# Patient Record
Sex: Male | Born: 1975 | Race: Black or African American | Hispanic: No | Marital: Single | Smoking: Current every day smoker
Health system: Southern US, Community
[De-identification: ages and names within clinical notes are randomized; demographics above are authoritative.]

---

## 1998-09-21 ENCOUNTER — Emergency Department (HOSPITAL_COMMUNITY): Admission: EM | Admit: 1998-09-21 | Discharge: 1998-09-21 | Payer: Self-pay | Admitting: Emergency Medicine

## 2004-05-26 ENCOUNTER — Emergency Department (HOSPITAL_COMMUNITY): Admission: EM | Admit: 2004-05-26 | Discharge: 2004-05-26 | Payer: Self-pay | Admitting: Emergency Medicine

## 2004-05-27 ENCOUNTER — Emergency Department (HOSPITAL_COMMUNITY): Admission: EM | Admit: 2004-05-27 | Discharge: 2004-05-27 | Payer: Self-pay | Admitting: Emergency Medicine

## 2005-07-06 ENCOUNTER — Emergency Department (HOSPITAL_COMMUNITY): Admission: EM | Admit: 2005-07-06 | Discharge: 2005-07-06 | Payer: Self-pay | Admitting: Emergency Medicine

## 2005-07-31 ENCOUNTER — Emergency Department (HOSPITAL_COMMUNITY): Admission: EM | Admit: 2005-07-31 | Discharge: 2005-07-31 | Payer: Self-pay | Admitting: Emergency Medicine

## 2007-05-11 IMAGING — CR DG CHEST 2V
2 series · 2 of 2 positions shown · non-contrast
Comparison: none

CLINICAL DATA: Cough.  Vomiting blood.
 PA AND LATERAL:

[w chest pa]
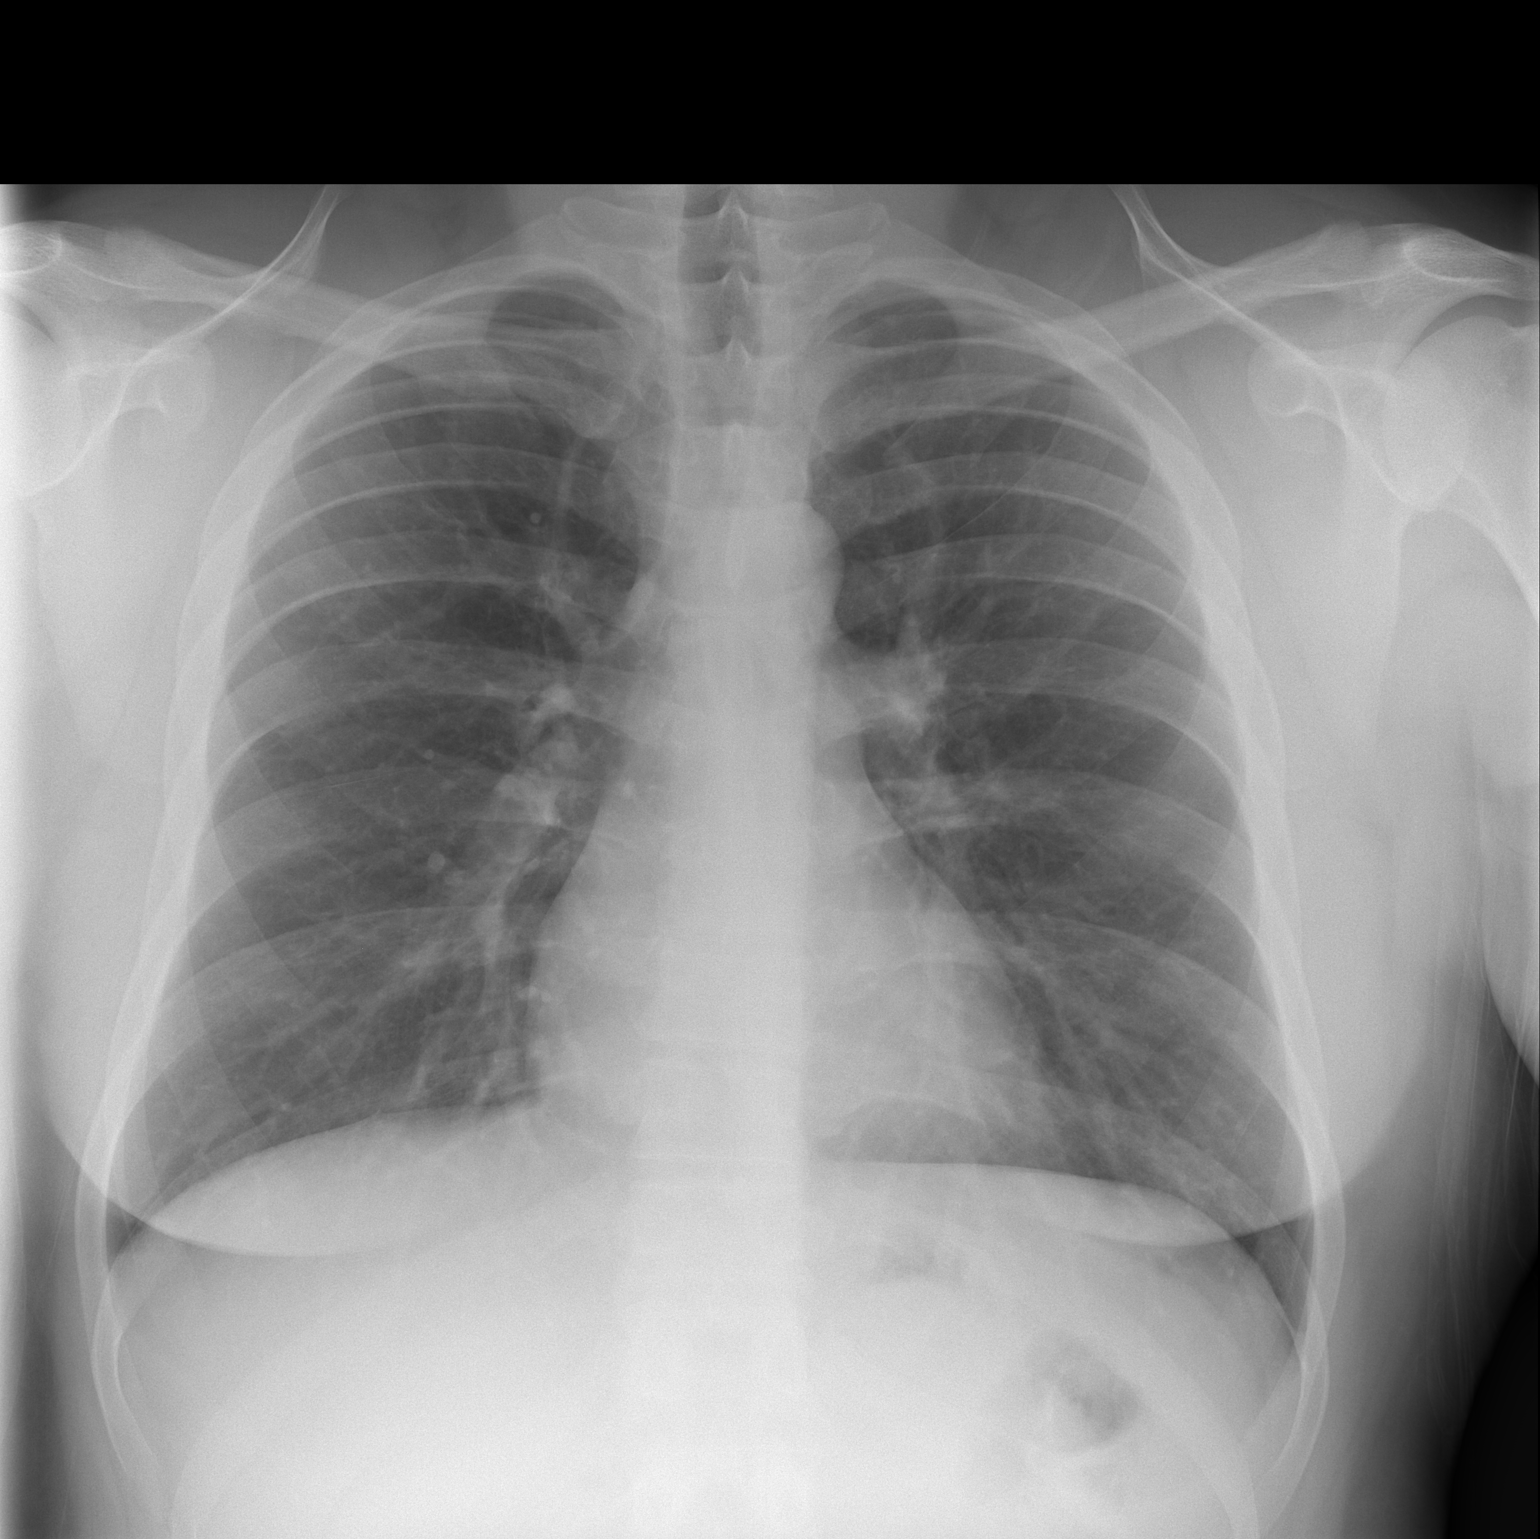

[w chest lat]
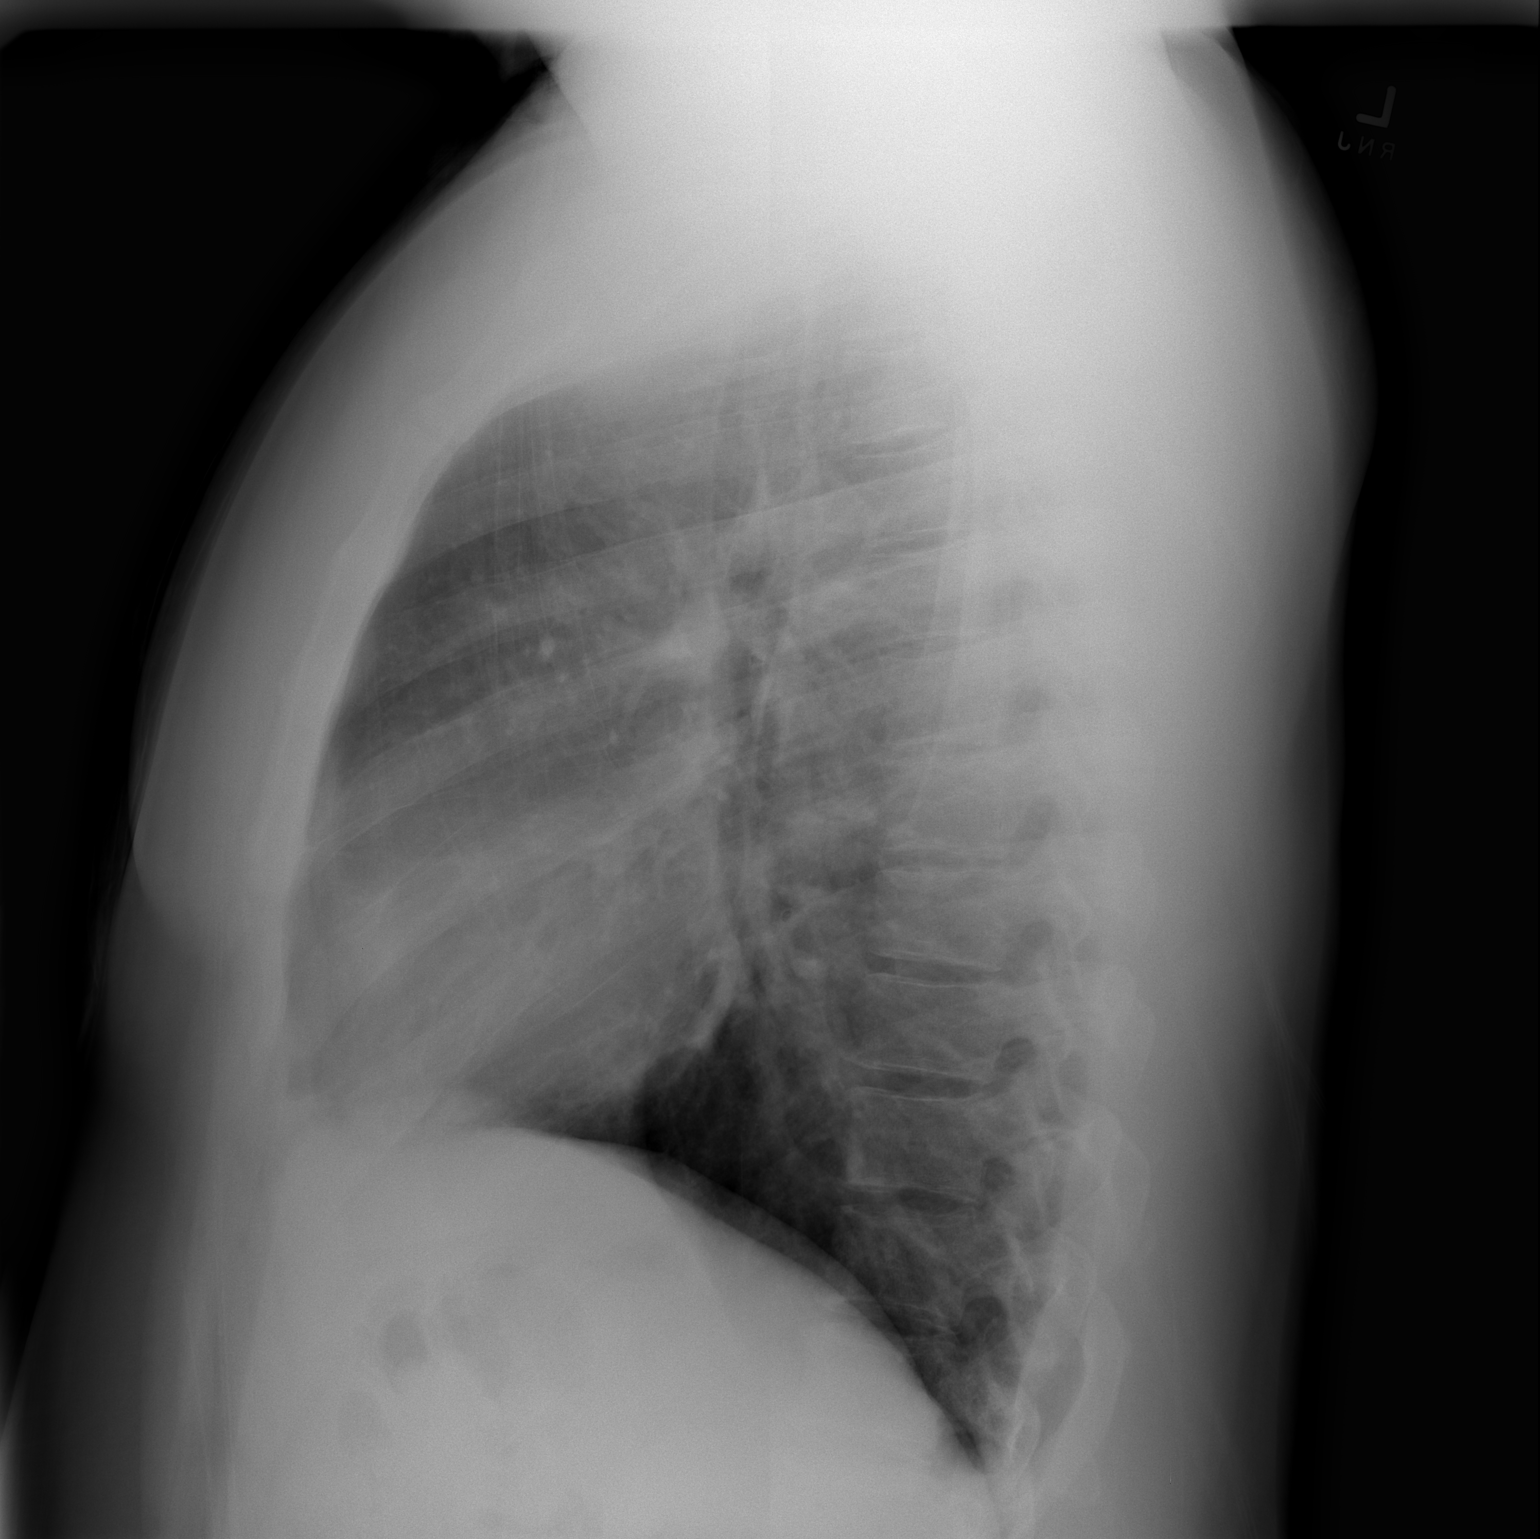

[2 of 2 positions shown; findings below may reference images not displayed]

FINDINGS: Lungs are clear.  Heart size is normal.  No effusion or focal bony abnormality.
IMPRESSION: No acute disease.

## 2007-06-05 IMAGING — CR DG CHEST 2V
2 series · 2 of 2 positions shown · non-contrast
Comparison: none

HISTORY: Chest pain, cough, dyspnea, smoker

CHEST 2 VIEWS:
Comparison 07/06/2005
Normal heart size, mediastinal contours, and vascularity.
Lungs clear.
No effusion or pneumothorax.
Bones unremarkable.

[w chest pa]
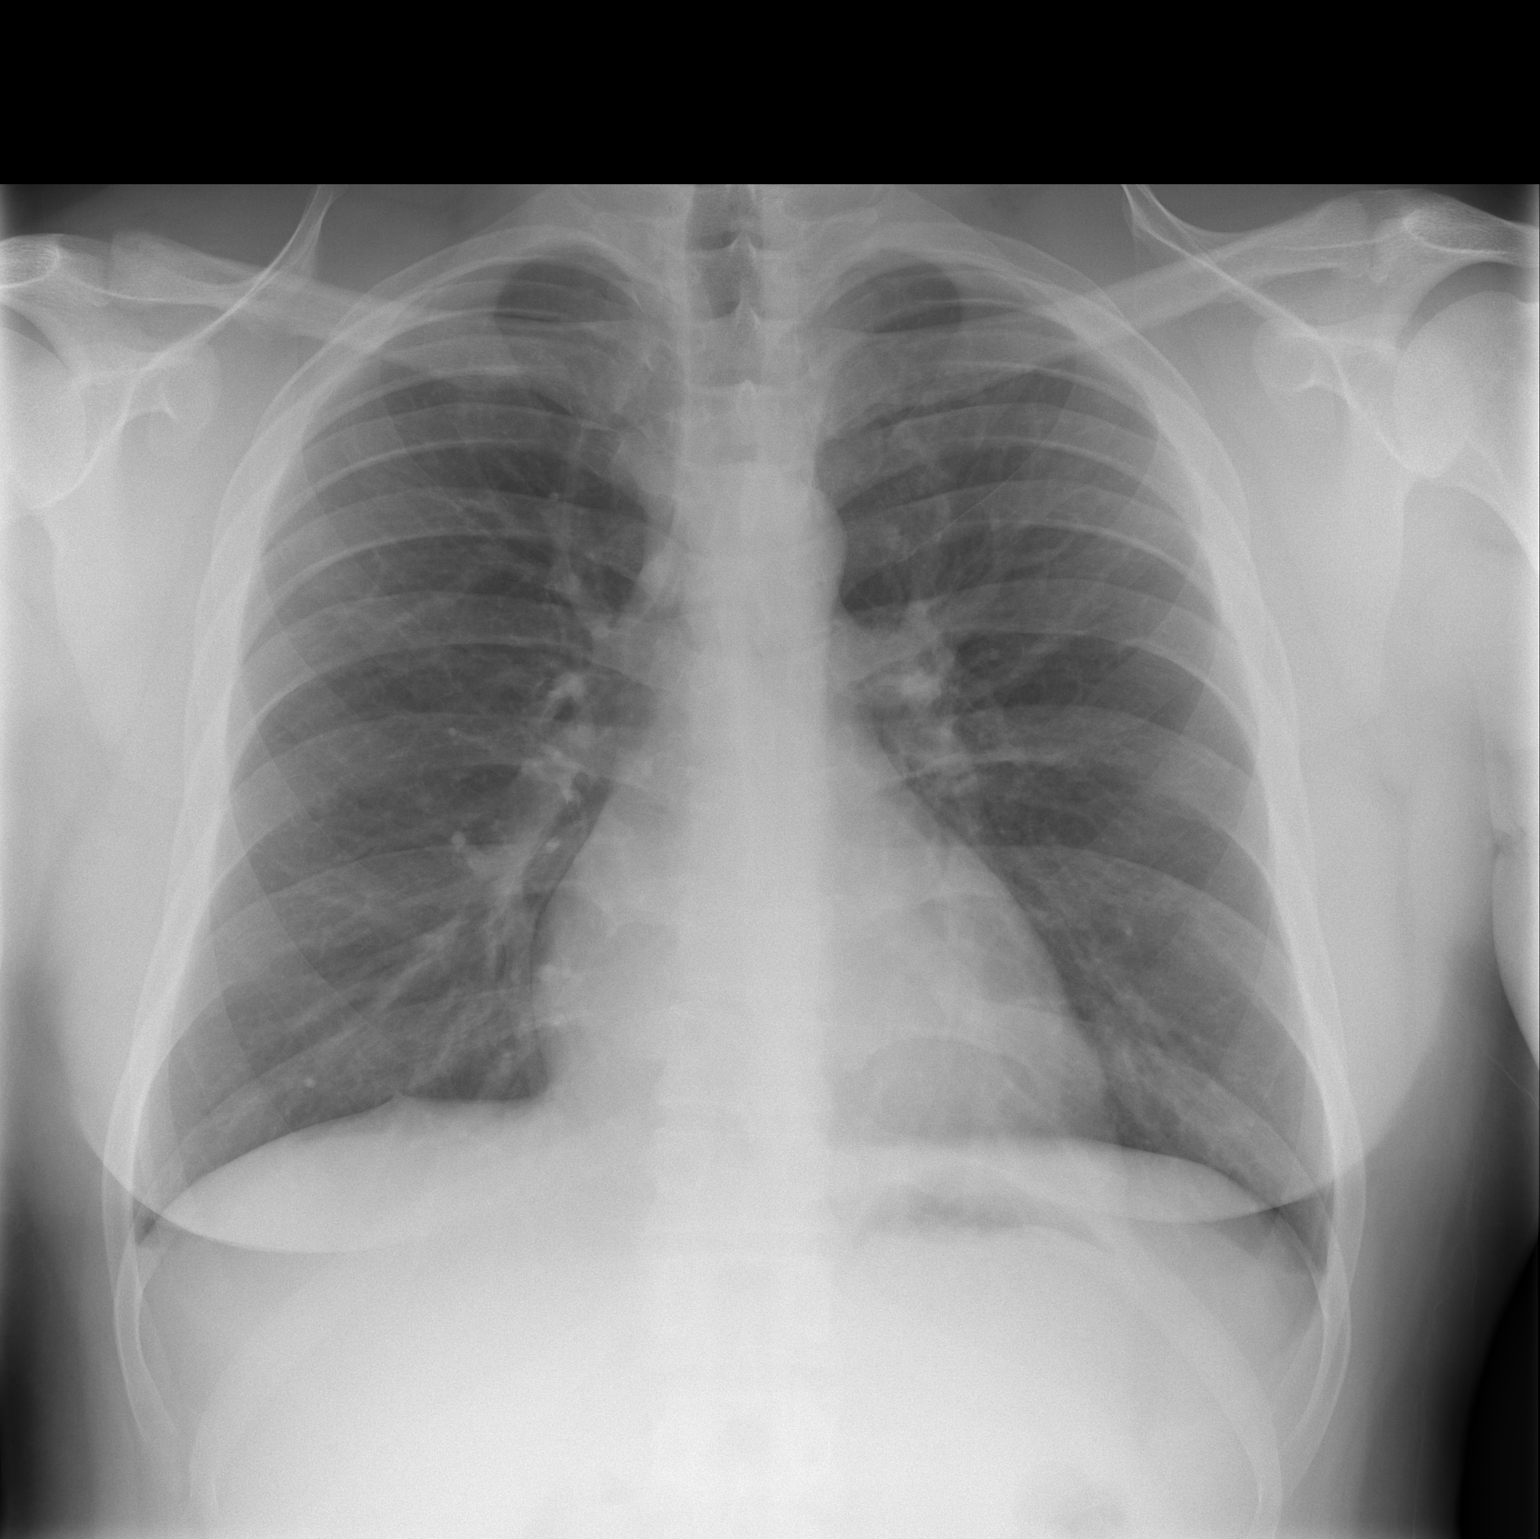

[w chest lat]
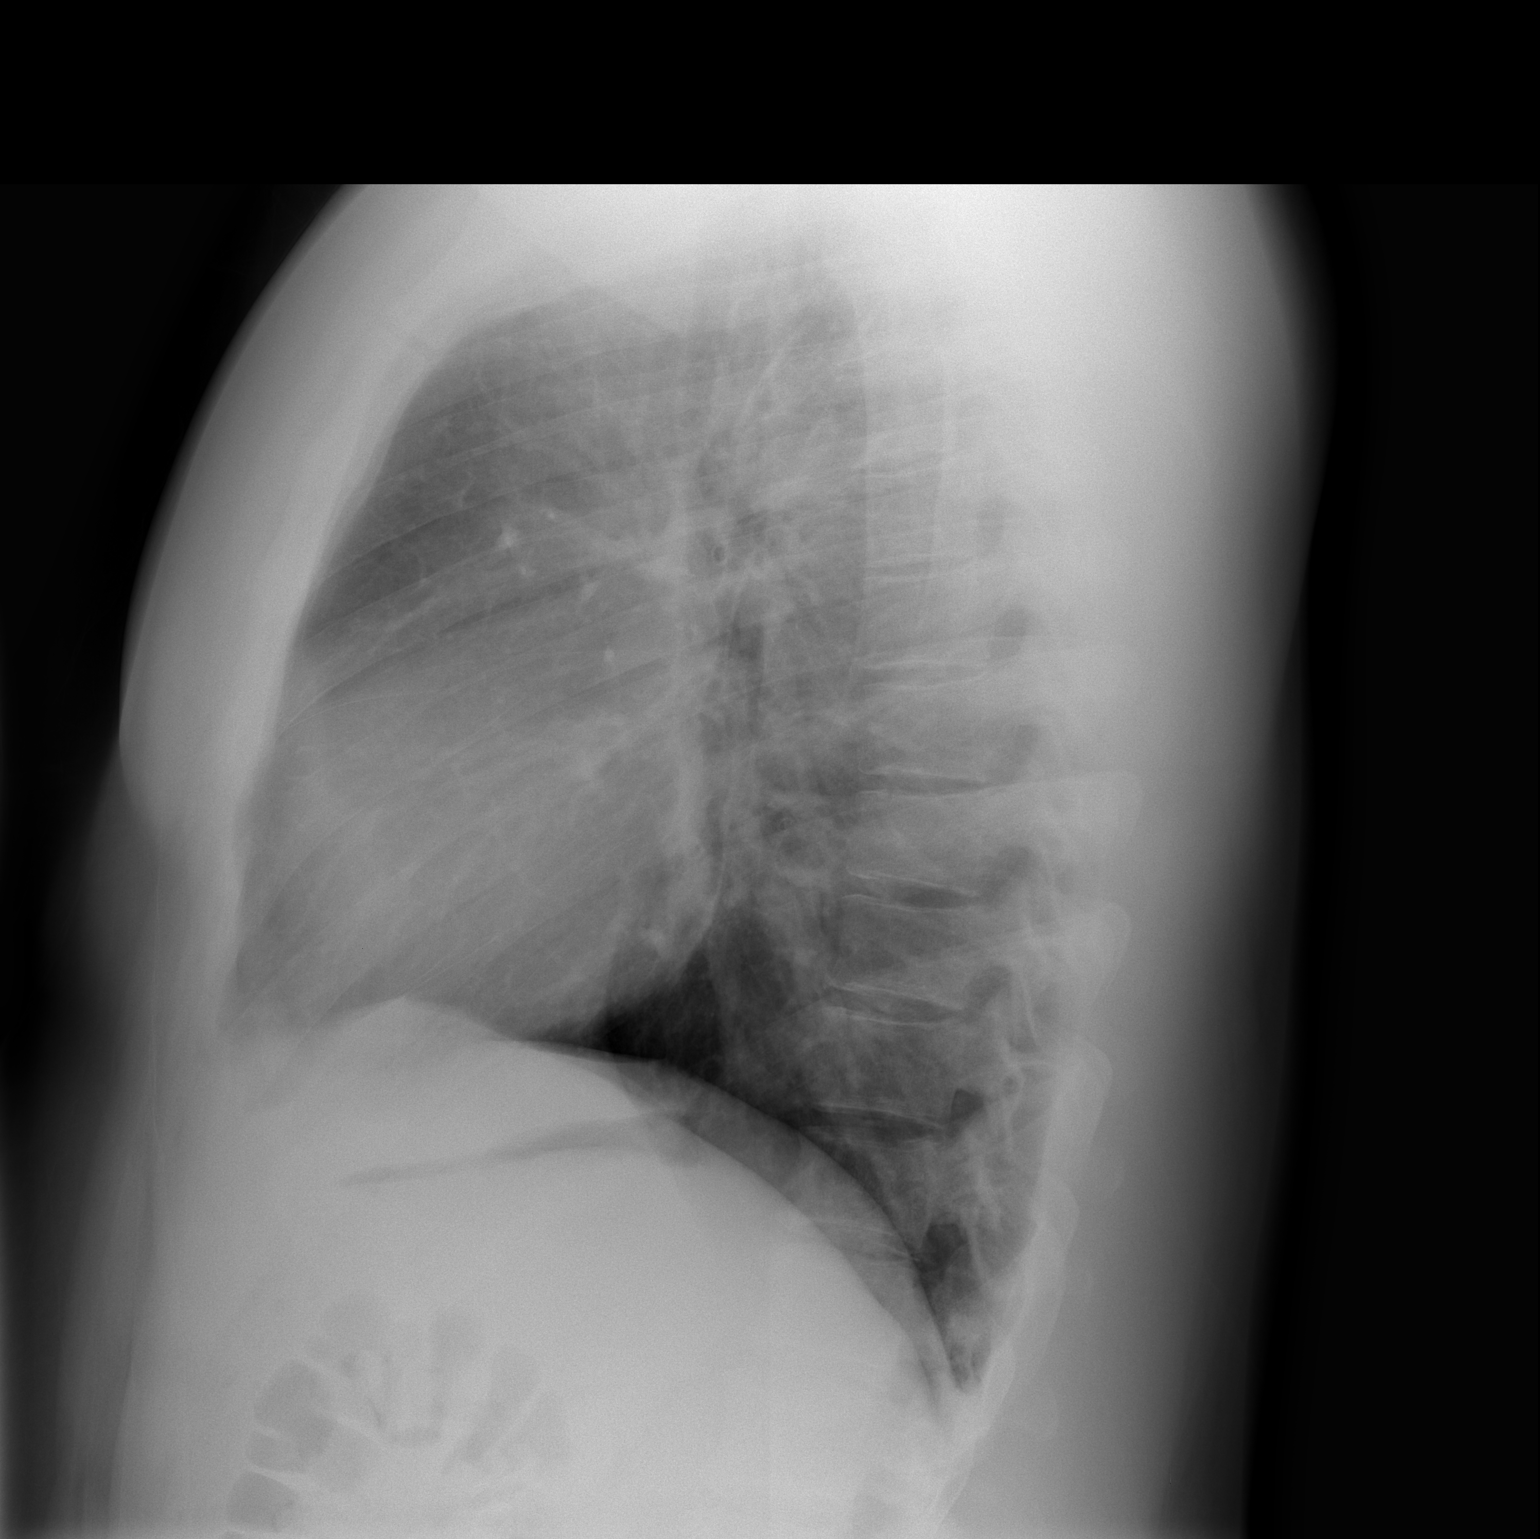

[2 of 2 positions shown; findings below may reference images not displayed]

IMPRESSION: No acute abnormalities.

## 2007-06-11 ENCOUNTER — Emergency Department (HOSPITAL_COMMUNITY): Admission: EM | Admit: 2007-06-11 | Discharge: 2007-06-11 | Payer: Self-pay | Admitting: Emergency Medicine

## 2007-06-11 ENCOUNTER — Emergency Department (HOSPITAL_COMMUNITY): Admission: EM | Admit: 2007-06-11 | Discharge: 2007-06-12 | Payer: Self-pay | Admitting: Emergency Medicine

## 2009-08-07 ENCOUNTER — Emergency Department (HOSPITAL_COMMUNITY): Admission: EM | Admit: 2009-08-07 | Discharge: 2009-08-07 | Payer: Self-pay | Admitting: Family Medicine

## 2010-04-13 ENCOUNTER — Emergency Department (HOSPITAL_COMMUNITY)
Admission: EM | Admit: 2010-04-13 | Discharge: 2010-04-14 | Disposition: A | Discharge status: Home / Self Care | Payer: Self-pay | Attending: Emergency Medicine | Admitting: Emergency Medicine

## 2010-04-13 DIAGNOSIS — R197 Diarrhea, unspecified: Secondary | ICD-10-CM | POA: Insufficient documentation

## 2010-04-13 DIAGNOSIS — F17200 Nicotine dependence, unspecified, uncomplicated: Secondary | ICD-10-CM | POA: Insufficient documentation

## 2010-04-13 DIAGNOSIS — R110 Nausea: Secondary | ICD-10-CM | POA: Insufficient documentation

## 2010-04-13 DIAGNOSIS — B9789 Other viral agents as the cause of diseases classified elsewhere: Secondary | ICD-10-CM | POA: Insufficient documentation

## 2010-04-13 LAB — URINALYSIS, ROUTINE W REFLEX MICROSCOPIC
Hgb urine dipstick: NEGATIVE
Nitrite: NEGATIVE
Urobilinogen, UA: 1 mg/dL (ref 0.0–1.0)
pH: 6 (ref 5.0–8.0)

## 2010-04-13 LAB — BASIC METABOLIC PANEL
CO2: 25 mEq/L (ref 19–32)
Creatinine, Ser: 1.55 mg/dL — ABNORMAL HIGH (ref 0.4–1.5)
GFR calc Af Amer: >60 mL/min (ref >60)
Potassium: 3.7 mEq/L (ref 3.5–5.1)
Sodium: 133 mEq/L — ABNORMAL LOW (ref 135–145)

## 2010-04-13 LAB — DIFFERENTIAL
Basophils Relative: 0 % (ref 0–1)
Eosinophils Absolute: 0.2 10*3/uL (ref 0.0–0.7)
Lymphocytes Relative: 17 % (ref 12–46)
Lymphs Abs: 1.5 10*3/uL (ref 0.7–4.0)
Neutro Abs: 6.7 10*3/uL (ref 1.7–7.7)

## 2010-04-13 LAB — CBC
HCT: 48.3 % (ref 39.0–52.0)
MCHC: 35.8 g/dL (ref 30.0–36.0)
Platelets: 227 10*3/uL (ref 150–400)
RBC: 5.53 MIL/uL (ref 4.22–5.81)
RDW: 12.5 % (ref 11.5–15.5)
WBC: 9.2 10*3/uL (ref 4.0–10.5)

## 2010-05-03 ENCOUNTER — Emergency Department (HOSPITAL_COMMUNITY)
Admission: EM | Admit: 2010-05-03 | Discharge: 2010-05-04 | Disposition: A | Discharge status: Home / Self Care | Payer: Self-pay | Attending: Emergency Medicine | Admitting: Emergency Medicine

## 2010-05-03 DIAGNOSIS — IMO0002 Reserved for concepts with insufficient information to code with codable children: Secondary | ICD-10-CM | POA: Insufficient documentation

## 2010-05-03 DIAGNOSIS — T169XXA Foreign body in ear, unspecified ear, initial encounter: Secondary | ICD-10-CM | POA: Insufficient documentation

## 2010-05-13 ENCOUNTER — Emergency Department (HOSPITAL_COMMUNITY)
Admission: EM | Admit: 2010-05-13 | Discharge: 2010-05-13 | Disposition: A | Discharge status: Home / Self Care | Payer: Self-pay | Attending: Emergency Medicine | Admitting: Emergency Medicine

## 2010-05-13 DIAGNOSIS — R21 Rash and other nonspecific skin eruption: Secondary | ICD-10-CM | POA: Insufficient documentation

## 2013-03-30 DIAGNOSIS — H18603 Keratoconus, unspecified, bilateral: Secondary | ICD-10-CM | POA: Insufficient documentation

## 2018-07-24 ENCOUNTER — Encounter: Payer: Self-pay | Admitting: Podiatry

## 2018-07-24 ENCOUNTER — Ambulatory Visit (INDEPENDENT_AMBULATORY_CARE_PROVIDER_SITE_OTHER): Payer: No Typology Code available for payment source

## 2018-07-24 ENCOUNTER — Ambulatory Visit (INDEPENDENT_AMBULATORY_CARE_PROVIDER_SITE_OTHER): Payer: No Typology Code available for payment source | Admitting: Podiatry

## 2018-07-24 ENCOUNTER — Other Ambulatory Visit: Payer: Self-pay

## 2018-07-24 ENCOUNTER — Other Ambulatory Visit: Payer: Self-pay | Admitting: Podiatry

## 2018-07-24 VITALS — BP 125/74 | HR 63 | Temp 98.6°F

## 2018-07-24 DIAGNOSIS — M779 Enthesopathy, unspecified: Secondary | ICD-10-CM

## 2018-07-24 DIAGNOSIS — M79671 Pain in right foot: Secondary | ICD-10-CM | POA: Diagnosis not present

## 2018-07-24 DIAGNOSIS — B351 Tinea unguium: Secondary | ICD-10-CM

## 2018-07-24 DIAGNOSIS — M7751 Other enthesopathy of right foot: Secondary | ICD-10-CM

## 2018-07-24 MED ORDER — TERBINAFINE HCL 250 MG PO TABS: 250.0000 mg | ORAL_TABLET | Freq: Every day | ORAL | 0 refills | Status: DC

## 2018-07-27 NOTE — Progress Notes (Signed)
Subjective:   Patient ID: Cameron Reynolds, male   DOB: 43 y.o.   MRN: [id number]   HPI Patient presents with a residue between the right fourth and fifth toes and was concerned about what it may be and states it is been painful to touch.  Patient does not smoke likes to be active   Review of Systems  All other systems reviewed and are negative.       Objective:  Physical Exam Vitals signs and nursing note reviewed.  Constitutional:      Appearance: He is well-developed.  Pulmonary:     Effort: Pulmonary effort is normal.  Musculoskeletal: Normal range of motion.  Skin:    General: Skin is warm.  Neurological:     Mental Status: He is alert.     Neurovascular status intact muscle strength was found to be adequate range of motion adequate with patient noted to have quite a bit of inflammation fourth interspace right with fluid around the fourth MPJ right type irritation of tissue but no breakdown or drainage noted    Assessment:  Laboratory capsulitis fourth MPJ right with possibility for bone against bone lesion formation versus fungal infection     Plan:  H&P condition reviewed and today careful injection administered fourth MPJ 3 mg Dexasone Kenalog 5 mg Xylocaine and placed him on short course 45 days of Lamisil along with topical Lamisil.  Reappoint if symptoms indicate and ultimately could require surgery

## 2018-07-28 ENCOUNTER — Other Ambulatory Visit: Payer: Self-pay

## 2018-07-28 ENCOUNTER — Ambulatory Visit (INDEPENDENT_AMBULATORY_CARE_PROVIDER_SITE_OTHER): Payer: No Typology Code available for payment source | Admitting: Podiatry

## 2018-07-28 ENCOUNTER — Ambulatory Visit (INDEPENDENT_AMBULATORY_CARE_PROVIDER_SITE_OTHER): Payer: No Typology Code available for payment source

## 2018-07-28 ENCOUNTER — Encounter: Payer: Self-pay | Admitting: Podiatry

## 2018-07-28 VITALS — Temp 98.3°F

## 2018-07-28 DIAGNOSIS — M79671 Pain in right foot: Secondary | ICD-10-CM

## 2018-07-28 DIAGNOSIS — M2041 Other hammer toe(s) (acquired), right foot: Secondary | ICD-10-CM | POA: Diagnosis not present

## 2018-07-28 NOTE — Patient Instructions (Signed)
Pre-Operative Instructions  Congratulations, you have decided to take an important step towards improving your quality of life.  You can be assured that the doctors and staff at [facility] will be with you every step of the way.  Here are some important things you should know:  1. Plan to be at the surgery center/hospital at least 1 (one) hour prior to your scheduled time, unless otherwise directed by the surgical center/hospital staff.  You must have a responsible adult accompany you, remain during the surgery and drive you home.  Make sure you have directions to the surgical center/hospital to ensure you arrive on time. 2. If you are having surgery at [facility] or [facility] hospitals, you will need a copy of your medical history and physical form from your family physician within one month prior to the date of surgery. We will give you a form for your primary physician to complete.  3. We make every effort to accommodate the date you request for surgery.  However, there are times where surgery dates or times have to be moved.  We will contact you as soon as possible if a change in schedule is required.   4. No aspirin/ibuprofen for one week before surgery.  If you are on aspirin, any non-steroidal anti-inflammatory medications (Mobic, Aleve, Ibuprofen) should not be taken seven (7) days prior to your surgery.  You make take Tylenol for pain prior to surgery.  5. Medications - If you are taking daily heart and blood pressure medications, seizure, reflux, allergy, asthma, anxiety, pain or diabetes medications, make sure you notify the surgery center/hospital before the day of surgery so they can tell you which medications you should take or avoid the day of surgery. 6. No food or drink after midnight the night before surgery unless directed otherwise by surgical center/hospital staff. 7. No alcoholic beverages 24-hours prior to surgery.  No smoking 24-hours prior or 24-hours after  surgery. 8. Wear loose pants or shorts. They should be loose enough to fit over bandages, boots, and casts. 9. Don't wear slip-on shoes. Sneakers are preferred. 10. Bring your boot with you to the surgery center/hospital.  Also bring crutches or a walker if your physician has prescribed it for you.  If you do not have this equipment, it will be provided for you after surgery. 11. If you have not been contacted by the surgery center/hospital by the day before your surgery, call to confirm the date and time of your surgery. 12. Leave-time from work may vary depending on the type of surgery you have.  Appropriate arrangements should be made prior to surgery with your employer. 13. Prescriptions will be provided immediately following surgery by your doctor.  Fill these as soon as possible after surgery and take the medication as directed. Pain medications will not be refilled on weekends and must be approved by the doctor. 14. Remove nail polish on the operative foot and avoid getting pedicures prior to surgery. 15. Wash the night before surgery.  The night before surgery wash the foot and leg well with water and the antibacterial soap provided. Be sure to pay special attention to beneath the toenails and in between the toes.  Wash for at least three (3) minutes. Rinse thoroughly with water and dry well with a towel.  Perform this wash unless told not to do so by your physician.  Enclosed: 1 Ice pack (please put in freezer the night before surgery)   1 Hibiclens skin cleaner     Pre-op instructions  If you have any questions regarding the instructions, please do not hesitate to call our office.  [place]: [address], [place], [place] [address] -- [phone]  [place]: [address] [place], [place] [address] -- [phone]  [place]: [address]  [place], [place] [address] -- [phone]  [place]: [address], [address], [place], [place] [address] -- [phone]  Website:  [email or web address] 

## 2018-07-29 ENCOUNTER — Telehealth: Payer: Self-pay | Admitting: *Deleted

## 2018-07-29 NOTE — Progress Notes (Signed)
Subjective:   Patient ID: Cameron Reynolds, male   DOB: 43 y.o.   MRN: [id number]   HPI Patient states the spot between my fourth and fifth toes remains very tender and medication did not help and I feel like I need to have it fixed as I cannot walk wear shoe gear or be active   ROS      Objective:  Physical Exam  Significant interspace lesion fourth right is very painful when pressed with keratotic tissue on both the proximal medial fifth digit and lateral fourth digit     Assessment:  Chronic lesion formation with irritated tissue and pain     Plan:  H&P reviewed condition and recommended partial syndactylization along with arthroplasty.  I reviewed the procedure risk and patient wants procedure understanding risk and at this time after extensive review patient signed consent form understanding risk of surgery and patient understands conservative and alternative treatments and is opted for surgical intervention.  Understands recovery can take upwards of 4 to 6 months and there is no long-term guarantees

## 2018-07-29 NOTE — Telephone Encounter (Signed)
DOS 08/05/2018; 72158 - HAMMER TOE REPAIR 5TH, 72761 - WEBBING PROCEDURE 4TH RIGHT FOOT  [employer] GOLDEN RULE: 03/26/2018 - 08/24/2018  (PRE-EXISTING PERIOD)  Deductible: $2500 Co-insurance: 60%  CALL REF. # - [id number]

## 2018-08-05 ENCOUNTER — Encounter: Payer: Self-pay | Admitting: Podiatry

## 2018-08-05 DIAGNOSIS — M2041 Other hammer toe(s) (acquired), right foot: Secondary | ICD-10-CM | POA: Diagnosis not present

## 2018-08-05 DIAGNOSIS — L852 Keratosis punctata (palmaris et plantaris): Secondary | ICD-10-CM

## 2018-08-06 ENCOUNTER — Telehealth: Payer: Self-pay | Admitting: *Deleted

## 2018-08-06 NOTE — Telephone Encounter (Signed)
Pt states had surgery 08/05/2018 and spoke with [facility] today. Pt states they directed him to call our office because he kicked a suitcase, and is not sure he is having normal pain or not.

## 2018-08-06 NOTE — Telephone Encounter (Signed)
I called pt and asked him to describe the sensation he is having in the surgery foot after kicking the suitcase. Pt states he went to the bathroom at 3:30am without the surgery shoe and hit the big toe. Pt states he is having some pain but nothing that he thinks is out of the ordinary. I told I would inform Dr. Paulla Dolly and call again if Dr. Paulla Dolly had further instructions.

## 2018-08-07 NOTE — Telephone Encounter (Signed)
Should be ok

## 2018-08-08 ENCOUNTER — Telehealth: Payer: Self-pay | Admitting: *Deleted

## 2018-08-08 NOTE — Telephone Encounter (Signed)
Called and spoke with the patient and the patient stated that he was doing good and would see [place] on Monday July 13th, 2020. Cameron Reynolds

## 2018-08-11 ENCOUNTER — Encounter: Payer: Self-pay | Admitting: Podiatry

## 2018-08-11 ENCOUNTER — Ambulatory Visit (INDEPENDENT_AMBULATORY_CARE_PROVIDER_SITE_OTHER): Payer: No Typology Code available for payment source

## 2018-08-11 ENCOUNTER — Ambulatory Visit: Payer: No Typology Code available for payment source | Admitting: Podiatry

## 2018-08-11 ENCOUNTER — Other Ambulatory Visit: Payer: Self-pay

## 2018-08-11 VITALS — Temp 98.3°F

## 2018-08-11 DIAGNOSIS — Z09 Encounter for follow-up examination after completed treatment for conditions other than malignant neoplasm: Secondary | ICD-10-CM

## 2018-08-11 DIAGNOSIS — M2041 Other hammer toe(s) (acquired), right foot: Secondary | ICD-10-CM

## 2018-08-11 NOTE — Progress Notes (Signed)
Subjective:   Patient ID: Cameron Reynolds, male   DOB: 43 y.o.   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

## 2018-08-20 ENCOUNTER — Ambulatory Visit (INDEPENDENT_AMBULATORY_CARE_PROVIDER_SITE_OTHER): Payer: Self-pay | Admitting: Podiatry

## 2018-08-20 ENCOUNTER — Other Ambulatory Visit: Payer: Self-pay

## 2018-08-20 ENCOUNTER — Encounter: Payer: Self-pay | Admitting: Podiatry

## 2018-08-20 ENCOUNTER — Other Ambulatory Visit: Payer: Self-pay | Admitting: Podiatry

## 2018-08-20 ENCOUNTER — Ambulatory Visit (INDEPENDENT_AMBULATORY_CARE_PROVIDER_SITE_OTHER): Payer: No Typology Code available for payment source

## 2018-08-20 VITALS — Temp 97.5°F

## 2018-08-20 DIAGNOSIS — M2041 Other hammer toe(s) (acquired), right foot: Secondary | ICD-10-CM

## 2018-08-20 DIAGNOSIS — M79671 Pain in right foot: Secondary | ICD-10-CM

## 2018-08-20 MED ORDER — AMOXICILLIN-POT CLAVULANATE 875-125 MG PO TABS: 1.0000 | ORAL_TABLET | Freq: Two times a day (BID) | ORAL | 0 refills | Status: DC

## 2018-08-20 NOTE — Progress Notes (Signed)
Subjective:   Patient ID: Cameron Reynolds, male   DOB: 43 y.o.   MRN: [id number]   HPI Patient states he was doing great with surgery and then all of a sudden a couple days ago he started to get some swelling in his forefoot right and pain   ROS      Objective:  Physical Exam  Neurovascular status was intact negative Homans sign noted with mild swelling and redness around the fourth interspace right.  I did not note any drainage at the incision site currently but it is tender and there is no proximal edema erythema drainage past this point and no systemic sign of infection with patient showing no indications of systemic infection when questioned     Assessment:  Probability for bacterial infiltration fourth interspace right with no current drainage for [place] to culture     Plan:  X-ray taken as precautionary measure and today I started on Augmentin 875 twice daily gave strict instructions if any systemic signs of infection or further redness or problems should occur to let us know if any indications of systemic problems were to occur patient is to go to the emergency room for possible IV antibiotic usage.  I did advise him that this should respond within 24 to 48 hours and to inform us if it does not  X-ray indicates head of fifth digit has been resected and everything looks healthy and I did not note any other pathology currently

## 2018-08-21 ENCOUNTER — Ambulatory Visit: Payer: No Typology Code available for payment source | Admitting: Podiatry

## 2018-08-25 ENCOUNTER — Ambulatory Visit (INDEPENDENT_AMBULATORY_CARE_PROVIDER_SITE_OTHER): Payer: No Typology Code available for payment source | Admitting: Podiatry

## 2018-08-25 ENCOUNTER — Ambulatory Visit (INDEPENDENT_AMBULATORY_CARE_PROVIDER_SITE_OTHER): Payer: No Typology Code available for payment source

## 2018-08-25 ENCOUNTER — Encounter: Payer: Self-pay | Admitting: Podiatry

## 2018-08-25 ENCOUNTER — Other Ambulatory Visit: Payer: Self-pay

## 2018-08-25 VITALS — Temp 98.0°F

## 2018-08-25 DIAGNOSIS — Z09 Encounter for follow-up examination after completed treatment for conditions other than malignant neoplasm: Secondary | ICD-10-CM

## 2018-08-25 DIAGNOSIS — M2041 Other hammer toe(s) (acquired), right foot: Secondary | ICD-10-CM | POA: Diagnosis not present

## 2018-08-25 MED ORDER — DOXYCYCLINE HYCLATE 100 MG PO TABS: 100.0000 mg | ORAL_TABLET | Freq: Two times a day (BID) | ORAL | 0 refills | Status: DC

## 2018-08-26 NOTE — Progress Notes (Signed)
Subjective:   Patient ID: Cameron Reynolds, male   DOB: 43 y.o.   MRN: [id number]   HPI Patient presents stating I am some improved but it still hurts some and the redness is gone down but still present   ROS      Objective:  Physical Exam  Fourth interspace right has no drainage currently there is still some swelling around the area localized with the incision intact and there is mild discomfort and mild redness around the area with no proximal edema erythema or drainage noted     Assessment:  Possibility for low-grade infective process after having syndactylization procedure removal of head of proximal phalanx fifth digit right with no drainage currently and wound edges that are coapted well but mild redness and discomfort     Plan:  H&P x-ray reviewed and today stitches removed with no gapping of the incision site or indications of drainage currently as precautionary measure 1 to keep him on antibiotics I placed him on doxycycline to see if this will be more effective in reducing remaining redness and discomfort he is experiencing I gave him strict instructions if any increase in redness were to occur or if any systemic signs of infection were to occur he is to go straight to the emergency room.  If this does not improve we may need to open this up for drainage but I did not note any abscess currently or other acute pathology.  Reappoint in 1 week or earlier if needed  X-ray indicates satisfactory section head of proximal phalanx fifth digit right with no ostial lysis present

## 2018-09-08 ENCOUNTER — Other Ambulatory Visit: Payer: No Typology Code available for payment source

## 2018-09-08 ENCOUNTER — Encounter: Payer: No Typology Code available for payment source | Admitting: Podiatry

## 2018-09-08 ENCOUNTER — Ambulatory Visit: Payer: No Typology Code available for payment source

## 2018-09-08 DIAGNOSIS — M2041 Other hammer toe(s) (acquired), right foot: Secondary | ICD-10-CM

## 2018-09-08 NOTE — Progress Notes (Signed)
This encounter was created in error - please disregard.

## 2019-05-07 ENCOUNTER — Ambulatory Visit: Payer: Self-pay | Attending: Internal Medicine

## 2019-05-07 DIAGNOSIS — Z23 Encounter for immunization: Secondary | ICD-10-CM

## 2019-05-07 NOTE — Progress Notes (Signed)
   Covid-19 Vaccination Clinic  Name:  ANGELITO HOPPING    MRN: [id number] DOB: 17-Jan-1976  05/07/2019  Mr. Montz was observed post Covid-19 immunization for 15 minutes without incident. He was provided with Vaccine Information Sheet and instruction to access the V-Safe system.   Mr. Febles was instructed to call 911 with any severe reactions post vaccine: Marland Kitchen Difficulty breathing  . Swelling of face and throat  . A fast heartbeat  . A bad rash all over body  . Dizziness and weakness   Immunizations Administered    Name Date Dose VIS Date Route   Pfizer COVID-19 Vaccine 05/07/2019  1:50 PM 0.3 mL 01/09/2019 Intramuscular   Manufacturer: [employer], [employer]   Lot: [id number]   NDC: [address]-8

## 2019-06-01 ENCOUNTER — Ambulatory Visit: Payer: Self-pay | Attending: Internal Medicine

## 2019-06-01 DIAGNOSIS — Z23 Encounter for immunization: Secondary | ICD-10-CM

## 2019-06-01 NOTE — Progress Notes (Signed)
   Covid-19 Vaccination Clinic  Name:  Cameron Reynolds    MRN: [id number] DOB: 1975/02/08  06/01/2019  [name] was observed post Covid-19 immunization for 15 minutes without incident. He was provided with Vaccine Information Sheet and instruction to access the V-Safe system.   [name] was instructed to call 911 with any severe reactions post vaccine: Marland Kitchen Difficulty breathing  . Swelling of face and throat  . A fast heartbeat  . A bad rash all over body  . Dizziness and weakness   Immunizations Administered    Name Date Dose VIS Date Route   Pfizer COVID-19 Vaccine 06/01/2019  8:45 AM 0.3 mL 03/25/2018 Intramuscular   Manufacturer: [employer], [employer]   Lot: [id number]   NDC: [address]-3

## 2021-03-11 ENCOUNTER — Encounter (HOSPITAL_BASED_OUTPATIENT_CLINIC_OR_DEPARTMENT_OTHER): Payer: Self-pay

## 2021-03-11 ENCOUNTER — Other Ambulatory Visit: Payer: Self-pay

## 2021-03-11 ENCOUNTER — Emergency Department (HOSPITAL_BASED_OUTPATIENT_CLINIC_OR_DEPARTMENT_OTHER)
Admission: EM | Admit: 2021-03-11 | Discharge: 2021-03-11 | Disposition: A | Discharge status: Home / Self Care | Payer: Self-pay | Attending: Emergency Medicine | Admitting: Emergency Medicine

## 2021-03-11 DIAGNOSIS — U071 COVID-19: Secondary | ICD-10-CM | POA: Insufficient documentation

## 2021-03-11 DIAGNOSIS — R197 Diarrhea, unspecified: Secondary | ICD-10-CM

## 2021-03-11 LAB — RESP PANEL BY RT-PCR (FLU A&B, COVID) ARPGX2
Influenza A by PCR: NEGATIVE
Influenza B by PCR: NEGATIVE
SARS Coronavirus 2 by RT PCR: POSITIVE — AB

## 2021-03-11 MED ORDER — LOPERAMIDE HCL 2 MG PO CAPS: 2.0000 mg | ORAL_CAPSULE | Freq: Four times a day (QID) | ORAL | 0 refills | Status: AC | PRN

## 2021-03-11 NOTE — ED Triage Notes (Signed)
Patient here POV from Home with Diarrhea.  Patient states yesterday he began to feel Fatigue which has since progressed into having Diarrhea and Chills.  No Fevers. No Cough.   NAD noted during Triage. A&Ox4. GCS 15. Ambulatory.

## 2021-03-11 NOTE — Discharge Instructions (Signed)
Please read and follow all provided instructions.  Your diagnoses today include:  1. COVID-19   2. Diarrhea, unspecified type     Tests performed today include: Vital signs. See below for your results today.  COVID test - positive  Medications prescribed:  Imodium - medication for diarrhea  Take any prescribed medications only as directed. Treatment for your infection is aimed at treating the symptoms. There are no medications, such as antibiotics, that will cure your infection.   Home care instructions:  Follow any educational materials contained in this packet.   Your illness is contagious and can be spread to others, especially during the first 3 or 4 days. It cannot be cured by antibiotics or other medicines. Take basic precautions such as washing your hands often, covering your mouth when you cough or sneeze, and avoiding public places where you could spread your illness to others.   Please continue drinking plenty of fluids.  Use over-the-counter medicines as needed as directed on packaging for symptom relief.  You may also use ibuprofen or tylenol as directed on packaging for pain or fever.  Do not take multiple medicines containing Tylenol or acetaminophen to avoid taking too much of this medication.  If you are positive for Covid-19, you should isolate yourself and not be exposed to other people for 5 days after your symptoms began. If you are not feeling better at day 5, you need to isolate yourself for a total of 10 days. If you are feeling better by day 5, you should wear a mask properly, over your nose and mouth, at all times while around other people until 10 days after your symptoms started.   Follow-up instructions: Please follow-up with your primary care provider as needed for further evaluation of your symptoms if you are not feeling better.   Return instructions:  Please return to the Emergency Department if you experience worsening symptoms.  Return to the emergency  department if you have worsening shortness of breath breathing or increased work of breathing, persistent vomiting RETURN IMMEDIATELY IF you develop shortness of breath, confusion or altered mental status, a new rash, become dizzy, faint, or poorly responsive, or are unable to be cared for at home. Please return if you have persistent vomiting and cannot keep down fluids or develop a fever that is not controlled by tylenol or motrin.   Please return if you have any other emergent concerns.  Additional Information:  Your vital signs today were: BP (!) 131/96 (BP Location: Right Arm)    Pulse 77    Temp 98.5 F (36.9 C) (Oral)    Resp 16    Ht 6\' 2"  (1.88 m)    Wt 127.9 kg    SpO2 96%    BMI 36.21 kg/m  If your blood pressure (BP) was elevated above 135/85 this visit, please have this repeated by your doctor within one month. --------------

## 2021-03-11 NOTE — ED Notes (Signed)
Dc instructions and scripts reviewed with pt no questions or concerns at this time.  

## 2021-03-11 NOTE — ED Provider Notes (Signed)
[facility] EMERGENCY DEPT Provider Note   CSN: [id number] Arrival date & time: 03/11/21  2009     History  Chief Complaint  Patient presents with   Diarrhea    Cameron Reynolds is a 46 y.o. male.  Patient with no past surgical history presents to the emergency department today for evaluation of diarrhea.  Patient states that he had extreme fatigue yesterday and slept for 11 hours last night.  He woke up today still feeling tired.  Earlier this morning he developed multiple episodes of none bloody, watery stool.  No abdominal cramping.  Last episode 1 hour prior to arrival.  No nausea or vomiting.  He is able to eat and drink.  No fevers but did have some chills.  No suspicious food or water exposures, recent travel, or antibiotic use.  He is concerned for COVID.      Home Medications Prior to Admission medications   Medication Sig Start Date End Date Taking? Authorizing Provider  amoxicillin-clavulanate (AUGMENTIN) 875-125 MG tablet Take 1 tablet by mouth 2 (two) times daily. 08/20/18   Wallene Huh, DPM  doxycycline (VIBRA-TABS) 100 MG tablet Take 1 tablet (100 mg total) by mouth 2 (two) times daily. 08/25/18   Wallene Huh, DPM  HYDROcodone-acetaminophen (NORCO) 10-325 MG tablet TAKE ONE TABLET BY MOUTH EVERY 4 6 HOURS AS NEEDED FOR PAIN 08/05/18   [provider]  terbinafine (LAMISIL) 250 MG tablet Take 1 tablet (250 mg total) by mouth daily. 07/24/18   Wallene Huh, DPM      Allergies    Patient has no known allergies.    Review of Systems   Review of Systems  Physical Exam Updated Vital Signs BP (!) 131/96 (BP Location: Right Arm)    Pulse 77    Temp 98.5 F (36.9 C) (Oral)    Resp 16    Ht 6\' 2"  (1.88 m)    Wt 127.9 kg    SpO2 96%    BMI 36.21 kg/m  Physical Exam Vitals and nursing note reviewed.  Constitutional:      General: He is not in acute distress.    Appearance: He is well-developed.  HENT:     Head: Normocephalic and  atraumatic.  Eyes:     General:        Right eye: No discharge.        Left eye: No discharge.     Conjunctiva/sclera: Conjunctivae normal.  Cardiovascular:     Rate and Rhythm: Normal rate and regular rhythm.     Heart sounds: Normal heart sounds.  Pulmonary:     Effort: Pulmonary effort is normal.     Breath sounds: Normal breath sounds.  Abdominal:     Palpations: Abdomen is soft.     Tenderness: There is no abdominal tenderness. There is no guarding or rebound.  Musculoskeletal:     Cervical back: Normal range of motion and neck supple.  Skin:    General: Skin is warm and dry.  Neurological:     Mental Status: He is alert.    ED Results / Procedures / Treatments   Labs (all labs ordered are listed, but only abnormal results are displayed) Labs Reviewed  RESP PANEL BY RT-PCR (FLU A&B, COVID) [id number]    EKG None  Radiology No results found.  Procedures Procedures    Medications Ordered in ED Medications - No data to display  ED Course/ Medical Decision Making/ A&P    Patient seen  and examined. History obtained directly from patient.    Labs/EKG: COVID testing, pending  Do not feel that other labs, imaging currently indicated.  Patient appears well, no abdominal pain or tenderness.  He is able to hydrate orally.    Most recent vital signs reviewed and are as follows: BP (!) 131/96 (BP Location: Right Arm)    Pulse 77    Temp 98.5 F (36.9 C) (Oral)    Resp 16    Ht 6\' 2"  (1.88 m)    Wt 127.9 kg    SpO2 96%    BMI 36.21 kg/m   Initial impression: Diarrheal illness  9:20 PM patient's COVID testing returned positive.  He has not had COVID in the past but has received vaccines.  Home treatment plan: Imodium for diarrhea, oral hydration  Detailed discussion had with with patient regarding COVID-19 precautions and written instructions given as well.  We discussed need to isolate themselves for 5 days from onset of symptoms and have 24 hours of improvement  prior to breaking isolation.  We discussed that when breaking isolation, mask wearing for 5 additional days is required.  We discussed signs symptoms to return which include worsening shortness of breath, trouble breathing, or increased work of breathing.  Also return with persistent vomiting, confusion, passing out, or if they have any other concerns. Counseled on the need for rest and good hydration. Discussed that high-risk contacts should be aware of positive result and they need to quarantine and be tested if they develop any symptoms. Patient verbalizes understanding.   BLAS FIKES was evaluated in Emergency Department on 03/11/2021 for the symptoms described in the history of present illness. He was evaluated in the context of the global COVID-19 pandemic, which necessitated consideration that the patient might be at risk for infection with the SARS-CoV-2 virus that causes COVID-19. Institutional protocols and algorithms that pertain to the evaluation of patients at risk for COVID-19 are in a state of rapid change based on information released by regulatory bodies including the CDC and federal and state organizations. These policies and algorithms were followed during the patient's care in the ED.                           Medical Decision Making  Patient with fatigue and diarrhea today.  COVID testing positive.  Abdomen is soft and nontender.  No respiratory symptoms at this point.  He is a smoker, discussed Paxlovid.  Patient defers at this time.  We will treat diarrhea symptomatically and with oral fluids.  He looks well.  He is given a work note.        Final Clinical Impression(s) / ED Diagnoses Final diagnoses:  COVID-19  Diarrhea, unspecified type    Rx / DC Orders ED Discharge Orders          Ordered    loperamide (IMODIUM) 2 MG capsule  4 times daily PRN        03/11/21 2119              Carlisle Cater, PA-C 03/11/21 2121    Lennice Sites, DO 03/11/21  2127
